# Patient Record
Sex: Male | Born: 1955 | Race: White | Hispanic: No | Marital: Married | Smoking: Never smoker
Health system: Southern US, Community
[De-identification: ages and names within clinical notes are randomized; demographics above are authoritative.]

## PROBLEM LIST (undated history)

## (undated) DIAGNOSIS — M109 Gout, unspecified: Secondary | ICD-10-CM

## (undated) DIAGNOSIS — Z87442 Personal history of urinary calculi: Secondary | ICD-10-CM

## (undated) DIAGNOSIS — N200 Calculus of kidney: Secondary | ICD-10-CM

## (undated) DIAGNOSIS — M1990 Unspecified osteoarthritis, unspecified site: Secondary | ICD-10-CM

## (undated) HISTORY — PX: KIDNEY STONE SURGERY: SHX686

## (undated) HISTORY — PX: APPENDECTOMY: SHX54

---

## 2007-08-24 ENCOUNTER — Emergency Department: Payer: Self-pay | Admitting: Unknown Physician Specialty

## 2007-08-28 ENCOUNTER — Ambulatory Visit: Payer: Self-pay | Admitting: Specialist

## 2007-08-29 ENCOUNTER — Ambulatory Visit: Payer: Self-pay | Admitting: Specialist

## 2007-09-20 ENCOUNTER — Ambulatory Visit: Payer: Self-pay | Admitting: Specialist

## 2009-10-29 ENCOUNTER — Encounter: Payer: Self-pay | Admitting: Gastroenterology

## 2009-11-01 ENCOUNTER — Ambulatory Visit (HOSPITAL_COMMUNITY): Admission: RE | Admit: 2009-11-01 | Discharge: 2009-11-01 | Payer: Self-pay | Admitting: Gastroenterology

## 2009-11-01 ENCOUNTER — Ambulatory Visit: Payer: Self-pay | Admitting: Gastroenterology

## 2010-06-21 NOTE — Letter (Signed)
Summary: instructions  instructions   Imported By: Diana Eves 10/29/2009 15:21:15  _____________________________________________________________________  External Attachment:    Type:   Image     Comment:   External Document

## 2010-06-21 NOTE — Letter (Signed)
Summary: Internal Other  Internal Other   Imported By: Diana Eves 10/29/2009 14:18:07  _____________________________________________________________________  External Attachment:    Type:   Image     Comment:   External Document  Appended Document: Internal Other HALFLYTELY.  Appended Document: Internal Other Informed wife of Rx and instructions faxed to [facility].

## 2011-03-16 ENCOUNTER — Emergency Department (HOSPITAL_COMMUNITY)
Admission: EM | Admit: 2011-03-16 | Discharge: 2011-03-17 | Disposition: A | Discharge status: Home / Self Care | Payer: Worker's Compensation | Attending: Emergency Medicine | Admitting: Emergency Medicine

## 2011-03-16 DIAGNOSIS — X58XXXA Exposure to other specified factors, initial encounter: Secondary | ICD-10-CM | POA: Insufficient documentation

## 2011-03-16 DIAGNOSIS — S61209A Unspecified open wound of unspecified finger without damage to nail, initial encounter: Secondary | ICD-10-CM | POA: Insufficient documentation

## 2014-11-09 ENCOUNTER — Emergency Department (HOSPITAL_COMMUNITY)
Admission: EM | Admit: 2014-11-09 | Discharge: 2014-11-09 | Disposition: A | Discharge status: Home / Self Care | Payer: Self-pay | Attending: Emergency Medicine | Admitting: Emergency Medicine

## 2014-11-09 ENCOUNTER — Encounter (HOSPITAL_COMMUNITY): Payer: Self-pay | Admitting: Emergency Medicine

## 2014-11-09 DIAGNOSIS — L291 Pruritus scroti: Secondary | ICD-10-CM | POA: Insufficient documentation

## 2014-11-09 DIAGNOSIS — M1990 Unspecified osteoarthritis, unspecified site: Secondary | ICD-10-CM | POA: Insufficient documentation

## 2014-11-09 DIAGNOSIS — Z87442 Personal history of urinary calculi: Secondary | ICD-10-CM | POA: Insufficient documentation

## 2014-11-09 DIAGNOSIS — M109 Gout, unspecified: Secondary | ICD-10-CM | POA: Insufficient documentation

## 2014-11-09 DIAGNOSIS — Z79899 Other long term (current) drug therapy: Secondary | ICD-10-CM | POA: Insufficient documentation

## 2014-11-09 HISTORY — DX: Gout, unspecified: M10.9

## 2014-11-09 HISTORY — DX: Calculus of kidney: N20.0

## 2014-11-09 HISTORY — DX: Unspecified osteoarthritis, unspecified site: M19.90

## 2014-11-09 NOTE — Discharge Instructions (Signed)
Pruritus  °Pruritus is an itch. There are many different problems that can cause an itch. Dry skin is one of the most common causes of itching. Most cases of itching do not require medical attention.  °HOME CARE INSTRUCTIONS  °Make sure your skin is moistened on a regular basis. A moisturizer that contains petroleum jelly is best for keeping moisture in your skin. If you develop a rash, you may try the following for relief:  °· Use corticosteroid cream. °· Apply cool compresses to the affected areas. °· Bathe with Epsom salts or baking soda in the bathwater. °· Soak in colloidal oatmeal baths. These are available at your pharmacy. °· Apply baking soda paste to the rash. Stir water into baking soda until it reaches a paste-like consistency. °· Use an anti-itch lotion. °· Take over-the-counter diphenhydramine medicine by mouth as the instructions direct. °· Avoid scratching. Scratching may cause the rash to become infected. If itching is very bad, your caregiver may suggest prescription lotions or creams to lessen your symptoms. °· Avoid hot showers, which can make itching worse. A cold shower may help with itching as long as you use a moisturizer after the shower. °SEEK MEDICAL CARE IF: °The itching does not go away after several days. °Document Released: 01/18/2011 Document Revised: 09/22/2013 Document Reviewed: 01/18/2011 °ExitCare® Patient Information ©2015 [employer]. This information is not intended to replace advice given to you by your health care provider. Make sure you discuss any questions you have with your health care provider. ° °

## 2014-11-09 NOTE — ED Notes (Addendum)
Pt reports itching in his groin and hands along with rednness and swelling in his groin. He reports shaving his groin yesterday, but with the same shave gel and soap that he "has used for years". He tried using cortisone cream on both locations, but the itching and redness have not subsided. Pt has had no difficulty urinating.

## 2014-11-09 NOTE — ED Provider Notes (Signed)
CSN: [id number]     Arrival date & time 11/09/14  7829 History   First MD Initiated Contact with Patient 11/09/14 [phone]     Chief Complaint  Patient presents with  . Pruritis     (Consider location/radiation/quality/duration/timing/severity/associated sxs/prior Treatment) HPI Comments: 59 yo male with scrotal itching.  Began a few hours ago.  Gradual onset.  Severe.  Improved eventually with hydrocortisone cream.  Nothing worsened.  Associated with mild swelling, which has now resolved.  No difficulty urinating, no testicular pain, no penile discharge.    Reports a scrotal lesion from a tick bite two weeks ago.  He removed the tick at that time.     Past Medical History  Diagnosis Date  . Arthritis   . Gout   . Kidney stone    Past Surgical History  Procedure Laterality Date  . Kidney stone surgery    . Appendectomy     History reviewed. No pertinent family history. History  Substance Use Topics  . Smoking status: Never Smoker   . Smokeless tobacco: Not on file  . Alcohol Use: Yes     Comment: occassionally     Review of Systems  All other systems reviewed and are negative.     Allergies  Review of patient's allergies indicates no known allergies.  Home Medications   Prior to Admission medications   Medication Sig Start Date End Date Taking? Authorizing Provider  chlorpheniramine (CHLOR-TRIMETON) 4 MG tablet Take 8 mg by mouth daily.   Yes Historical Provider, MD  colchicine 0.6 MG tablet Take 0.6 mg by mouth daily.   Yes Historical Provider, MD  nabumetone (RELAFEN) 750 MG tablet Take 750 mg by mouth daily as needed for moderate pain.   Yes Historical Provider, MD  naproxen sodium (ANAPROX) 220 MG tablet Take 440 mg by mouth 2 (two) times daily as needed (pain).   Yes Historical Provider, MD   BP 137/99 mmHg  Pulse 91  Temp(Src) 97.9 F (36.6 C) (Oral)  Resp 18  Ht  (1.753 m)  Wt 215 lb (97.523 kg)  BMI 31.74 kg/m2  SpO2 99% Physical Exam   Constitutional: He is oriented to person, place, and time. He appears well-developed and well-nourished. No distress.  HENT:  Head: Normocephalic and atraumatic.  Eyes: Conjunctivae are normal. No scleral icterus.  Neck: Neck supple.  Cardiovascular: Normal rate and intact distal pulses.   Pulmonary/Chest: Effort normal. No stridor. No respiratory distress.  Abdominal: Normal appearance. He exhibits no distension.  Neurological: He is alert and oriented to person, place, and time.  Skin: Skin is warm and dry. No rash noted.  No rash on hands. No scrotal swelling or erythema.  Skin recently shaved.  No excoriations.  Small papular lesion in left hemiscrotum reported to be from recent tick bite.  No testicular tenderness or masses.  No GU lymphadenopathy.   Psychiatric: He has a normal mood and affect. His behavior is normal.  Nursing note and vitals reviewed.   ED Course  Procedures (including critical care time) Labs Review Labs Reviewed - No data to display  Imaging Review No results found.   EKG Interpretation None      MDM   Final diagnoses:  Scrotal itching    59 yo male with scrotal itching, now near resolved.  Could be from contact dermatitis or could be related to shaving his groin yesterday.  No evidence of infection.  Advised supportive treatment and avoiding exposures.  Blake Divine, MD 11/09/14 [phone]

## 2015-08-16 DIAGNOSIS — Z79899 Other long term (current) drug therapy: Secondary | ICD-10-CM | POA: Insufficient documentation

## 2015-08-16 DIAGNOSIS — M1A00X0 Idiopathic chronic gout, unspecified site, without tophus (tophi): Secondary | ICD-10-CM | POA: Insufficient documentation

## 2015-10-24 ENCOUNTER — Encounter (HOSPITAL_COMMUNITY): Payer: Self-pay | Admitting: Emergency Medicine

## 2015-10-24 ENCOUNTER — Emergency Department (HOSPITAL_COMMUNITY): Payer: Managed Care, Other (non HMO)

## 2015-10-24 ENCOUNTER — Emergency Department (HOSPITAL_COMMUNITY)
Admission: EM | Admit: 2015-10-24 | Discharge: 2015-10-24 | Disposition: A | Discharge status: Home / Self Care | Payer: Managed Care, Other (non HMO) | Attending: Emergency Medicine | Admitting: Emergency Medicine

## 2015-10-24 DIAGNOSIS — M25572 Pain in left ankle and joints of left foot: Secondary | ICD-10-CM | POA: Diagnosis not present

## 2015-10-24 DIAGNOSIS — Y9389 Activity, other specified: Secondary | ICD-10-CM | POA: Insufficient documentation

## 2015-10-24 DIAGNOSIS — M7051 Other bursitis of knee, right knee: Secondary | ICD-10-CM | POA: Insufficient documentation

## 2015-10-24 DIAGNOSIS — M25531 Pain in right wrist: Secondary | ICD-10-CM | POA: Diagnosis not present

## 2015-10-24 DIAGNOSIS — M109 Gout, unspecified: Secondary | ICD-10-CM | POA: Diagnosis present

## 2015-10-24 MED ORDER — OXYCODONE-ACETAMINOPHEN 5-325 MG PO TABS: 1.0000 | ORAL_TABLET | ORAL | Status: DC | PRN

## 2015-10-24 MED ORDER — DEXAMETHASONE SODIUM PHOSPHATE 4 MG/ML IJ SOLN
10.0000 mg | Freq: Once | INTRAMUSCULAR | Status: AC
  Administered 2015-10-24: 10 mg via INTRAMUSCULAR
  Filled 2015-10-24: qty 3

## 2015-10-24 MED ORDER — OXYCODONE-ACETAMINOPHEN 5-325 MG PO TABS
1.0000 | ORAL_TABLET | Freq: Once | ORAL | Status: AC
  Administered 2015-10-24: 1 via ORAL
  Filled 2015-10-24: qty 1

## 2015-10-24 NOTE — ED Notes (Signed)
Pt states he has been having a gout flare for about 2 weeks.  C/o pain in right wrist, left ankle, and right knee.  States is being tx with allopurinol but not helping.

## 2015-10-24 NOTE — Discharge Instructions (Signed)
Gout °Gout is when your joints become red, sore, and swell (inflamed). This is caused by the buildup of uric acid crystals in the joints. Uric acid is a chemical that is normally in the blood. If the level of uric acid gets too high in the blood, these crystals form in your joints and tissues. Over time, these crystals can form into masses near the joints and tissues. These masses can destroy bone and cause the bone to look misshapen (deformed). °HOME CARE  °· Do not take aspirin for pain. °· Only take medicine as told by your doctor. °· Rest the joint as much as you can. When in bed, keep sheets and blankets off painful areas. °· Keep the sore joints raised (elevated). °· Put warm or cold packs on painful joints. Use of warm or cold packs depends on which works best for you. °· Use crutches if the painful joint is in your leg. °· Drink enough fluids to keep your pee (urine) clear or pale yellow. Limit alcohol, sugary drinks, and drinks with fructose in them. °· Follow your diet instructions. Pay careful attention to how much protein you eat. Include fruits, vegetables, whole grains, and fat-free or low-fat milk products in your daily diet. Talk to your doctor or dietitian about the use of coffee, vitamin C, and cherries. These may help lower uric acid levels. °· Keep a healthy body weight. °GET HELP RIGHT AWAY IF:  °· You have watery poop (diarrhea), throw up (vomit), or have any side effects from medicines. °· You do not feel better in 24 hours, or you are getting worse. °· Your joint becomes suddenly more tender, and you have chills or a fever. °MAKE SURE YOU:  °· Understand these instructions. °· Will watch your condition. °· Will get help right away if you are not doing well or get worse. °  °This information is not intended to replace advice given to you by your health care provider. Make sure you discuss any questions you have with your health care provider. °  °Document Released: 02/15/2008 Document Revised:  05/29/2014 Document Reviewed: 12/20/2011 °Elsevier Interactive Patient Education ©2016 [employer] ° °

## 2015-10-24 NOTE — ED Provider Notes (Signed)
CSN: [id number]     Arrival date & time 10/24/15  1525 History   By signing my name below, I, Marc Castillo, attest that this documentation has been prepared under the direction and in the presence of [name], PA-C. Electronically Signed: Evon Slackerrance Castillo, ED Scribe. 10/24/2015. 3:50 PM.    Chief Complaint  Patient presents with  . Gout   The history is provided by the patient. No language interpreter was used.   HPI Comments: Marc Castillo is a 60 y.o. male who presents to the Emergency Department complaining of worsening gout flare in his left heel and right knee for the last 2 weeks. Pt is also complaining of right wrist pain as well. Pt reports that he does a lot of repetitive work with his right wrist. He states that he has associated swelling. Pt presents wearing a wrist splint. Pt states that he has applied ice with no relief. Pt states that he has been taking allopurinol with no relief. Pt reports that he recently had a medication change in December 2016 and his gout has not been well controlled since. Pt states that he usually has relief with colchicine or steroid taper. Pt states that he is suppose to start Uloric in 2 days. He denies fever, chills, redness. Numbness or weakness  Past Medical History  Diagnosis Date  . Arthritis   . Gout   . Kidney stone    Past Surgical History  Procedure Laterality Date  . Kidney stone surgery    . Appendectomy     History reviewed. No pertinent family history. Social History  Substance Use Topics  . Smoking status: Never Smoker   . Smokeless tobacco: None  . Alcohol Use: Yes     Comment: occassionally     Review of Systems  Constitutional: Negative for fever and chills.  Respiratory: Negative for shortness of breath.   Cardiovascular: Negative for chest pain.  Gastrointestinal: Negative for nausea and vomiting.  Musculoskeletal: Positive for joint swelling and arthralgias.  Skin: Negative for color change and wound.   Neurological: Negative for dizziness, weakness and numbness.  All other systems reviewed and are negative.    Allergies  Review of patient's allergies indicates no known allergies.  Home Medications   Prior to Admission medications   Medication Sig Start Date End Date Taking? Authorizing Provider  chlorpheniramine (CHLOR-TRIMETON) 4 MG tablet Take 8 mg by mouth daily.    Historical Provider, MD  colchicine 0.6 MG tablet Take 0.6 mg by mouth daily.    Historical Provider, MD  nabumetone (RELAFEN) 750 MG tablet Take 750 mg by mouth daily as needed for moderate pain.    Historical Provider, MD  naproxen sodium (ANAPROX) 220 MG tablet Take 440 mg by mouth 2 (two) times daily as needed (pain).    Historical Provider, MD   BP 129/73 mmHg  Pulse 99  Temp(Src) 98.1 F (36.7 C) (Oral)  Resp 18  Ht 5\' 9"  (1.753 m)  Wt 215 lb (97.523 kg)  BMI 31.74 kg/m2  SpO2 99%    Physical Exam  Constitutional: He is oriented to person, place, and time. He appears well-developed and well-nourished. No distress.  HENT:  Head: Normocephalic and atraumatic.  Eyes: Conjunctivae and EOM are normal.  Neck: Neck supple. No tracheal deviation present.  Cardiovascular: Normal rate, regular rhythm and normal heart sounds.   Pulmonary/Chest: Effort normal. No respiratory distress.  Musculoskeletal: Normal range of motion. He exhibits tenderness.  Diffuse edema and tenderness of the  distal right wrist, no erythema, sensation intact. Mild edema and tenderness of the left ankle, pain through ROM of the right knee, mild crepitus.    Neurological: He is alert and oriented to person, place, and time.  Skin: Skin is warm and dry.  Psychiatric: He has a normal mood and affect. His behavior is normal.  Nursing note and vitals reviewed.   ED Course  Procedures (including critical care time) DIAGNOSTIC STUDIES: Oxygen Saturation is 99% on RA, normal by my interpretation.    COORDINATION OF CARE: 3:48  PM-Discussed treatment plan with pt at bedside and pt agreed to plan.     Labs Review Labs Reviewed - No data to display  Imaging Review Dg Knee Complete 4 Views Right  10/24/2015  CLINICAL DATA:  Anterior knee pain and swelling. No known injury. History of gout. EXAM: RIGHT KNEE - COMPLETE 4+ VIEW COMPARISON:  None. FINDINGS: The mineralization and alignment are normal. There is no evidence of acute fracture or dislocation. The joint spaces are maintained. There is mild spurring at the quadriceps insertion on the patella. There is no significant joint effusion, although there is mild anterior soft tissue edema. No soft tissue calcifications or erosions are seen. IMPRESSION: Prepatellar soft tissue edema suggesting possible prepatellar bursitis. No acute osseous findings or significant joint effusion. Electronically Signed   By: Carey Bullocks M.D.   On: 10/24/2015 16:35       EKG Interpretation None      MDM   Final diagnoses:  Acute gout of left foot, unspecified cause  Bursitis, knee, right   Pt well appearing, non-toxic.  No concerning sx's for septic joint.  Has hx of gout which is likely to the left foot.  I fel that right knee pain may be related to a inflammatory bursitis.  Pt is to start Urolic in two days.  IM injection of decadron given, percocet.  Pain improved.  Remains NV intact.  Agrees to arrange orthopedic f/u, referral info given  I personally performed the services described in this documentation, which was scribed in my presence. The recorded information has been reviewed and is accurate.      Pauline Aus, PA-C 10/25/15 2049  Marily Memos, MD 10/26/15 [phone]

## 2016-10-05 DIAGNOSIS — M7730 Calcaneal spur, unspecified foot: Secondary | ICD-10-CM | POA: Insufficient documentation

## 2019-10-07 ENCOUNTER — Encounter: Payer: Self-pay | Admitting: Gastroenterology

## 2020-02-01 ENCOUNTER — Encounter (HOSPITAL_COMMUNITY): Payer: Self-pay | Admitting: Emergency Medicine

## 2020-02-01 ENCOUNTER — Other Ambulatory Visit: Payer: Self-pay

## 2020-02-01 ENCOUNTER — Emergency Department (HOSPITAL_COMMUNITY)
Admission: EM | Admit: 2020-02-01 | Discharge: 2020-02-01 | Disposition: A | Discharge status: Home / Self Care | Payer: 59 | Attending: Emergency Medicine | Admitting: Emergency Medicine

## 2020-02-01 ENCOUNTER — Emergency Department (HOSPITAL_COMMUNITY): Payer: 59

## 2020-02-01 DIAGNOSIS — Y9389 Activity, other specified: Secondary | ICD-10-CM | POA: Diagnosis not present

## 2020-02-01 DIAGNOSIS — W271XXA Contact with garden tool, initial encounter: Secondary | ICD-10-CM | POA: Insufficient documentation

## 2020-02-01 DIAGNOSIS — Y92007 Garden or yard of unspecified non-institutional (private) residence as the place of occurrence of the external cause: Secondary | ICD-10-CM | POA: Insufficient documentation

## 2020-02-01 DIAGNOSIS — Z79899 Other long term (current) drug therapy: Secondary | ICD-10-CM | POA: Insufficient documentation

## 2020-02-01 DIAGNOSIS — Y999 Unspecified external cause status: Secondary | ICD-10-CM | POA: Diagnosis not present

## 2020-02-01 DIAGNOSIS — S59911A Unspecified injury of right forearm, initial encounter: Secondary | ICD-10-CM | POA: Diagnosis not present

## 2020-02-01 DIAGNOSIS — S4991XA Unspecified injury of right shoulder and upper arm, initial encounter: Secondary | ICD-10-CM

## 2020-02-01 NOTE — ED Triage Notes (Signed)
Pt reports right arm pain. Pt reports "it hurts to turn my forearm around." Pt reports trying to stop a lawnmower from rolling with his right arm.

## 2020-02-01 NOTE — ED Provider Notes (Signed)
[facility] EMERGENCY DEPARTMENT Provider Note   CSN: [id number] Arrival date & time: 02/01/20  0239     History Chief Complaint  Patient presents with  . Arm Injury    Marc Castillo is a 64 y.o. male.  HPI     This is a 64 year old male with a history of gout and arthritis who presents with right arm pain.  Patient reports 1 week ago he was stopping a lawnmower with his right hand his hand was pushed backwards.  He did not take a direct hit to the forearm that has since had pain along the forearm and swelling of the anterior aspect of the proximal forearm.  He also states he has had significant pain with pronation and supination.  He is right-handed.  Tonight while he was at work he felt a pop and had worsening pain.  He has been taking indomethacin which has helped he is also using ice.  No numbness or tingling.  Currently his pain is 0 when he is at rest.  Past Medical History:  Diagnosis Date  . Arthritis   . Gout   . Kidney stone     There are no problems to display for this patient.   Past Surgical History:  Procedure Laterality Date  . APPENDECTOMY    . KIDNEY STONE SURGERY         No family history on file.  Social History   Tobacco Use  . Smoking status: Never Smoker  . Smokeless tobacco: Never Used  Substance Use Topics  . Alcohol use: Yes    Comment: occassionally   . Drug use: Not on file    Home Medications Prior to Admission medications   Medication Sig Start Date End Date Taking? Authorizing Provider  chlorpheniramine (CHLOR-TRIMETON) 4 MG tablet Take 8 mg by mouth daily.    [provider]  colchicine 0.6 MG tablet Take 0.6 mg by mouth daily.    [provider]  nabumetone (RELAFEN) 750 MG tablet Take 750 mg by mouth daily as needed for moderate pain.    [provider]  naproxen sodium (ANAPROX) 220 MG tablet Take 440 mg by mouth 2 (two) times daily as needed (pain).    [provider]    oxyCODONE-acetaminophen (PERCOCET/ROXICET) 5-325 MG tablet Take 1 tablet by mouth every 4 (four) hours as needed. 10/24/15   Triplett, Tammy, PA-C    Allergies    Patient has no known allergies.  Review of Systems   Review of Systems  Musculoskeletal:       Right arm pain  Neurological: Negative for weakness and numbness.  All other systems reviewed and are negative.   Physical Exam Updated Vital Signs BP (!) 152/105   Pulse 90   Temp 98 F (36.7 C) (Oral)   Resp 18   Ht 1.753 m (5\' 9" )   Wt 97.5 kg   SpO2 95%   BMI 31.75 kg/m   Physical Exam Vitals and nursing note reviewed.  Constitutional:      Appearance: He is well-developed.  HENT:     Head: Normocephalic and atraumatic.     Mouth/Throat:     Mouth: Mucous membranes are moist.  Eyes:     Pupils: Pupils are equal, round, and reactive to light.  Cardiovascular:     Rate and Rhythm: Normal rate and regular rhythm.  Pulmonary:     Effort: Pulmonary effort is normal. No respiratory distress.  Musculoskeletal:  General: No deformity.     Cervical back: Neck supple.     Comments: Focused examination of the right upper extremity with tenderness along the mid towards the proximal forearm anteriorly, no significant swelling noted, both pronation and supination as well as flexion and extension at the elbow, slight bruising noted over the mid forearm  Skin:    General: Skin is warm and dry.  Neurological:     Mental Status: He is alert and oriented to person, place, and time.  Psychiatric:        Mood and Affect: Mood normal.     ED Results / Procedures / Treatments   Labs (all labs ordered are listed, but only abnormal results are displayed) Labs Reviewed - No data to display  EKG None  Radiology DG Forearm Right  Result Date: 02/01/2020 CLINICAL DATA:  Mild trauma. EXAM: RIGHT FOREARM - 2 VIEW COMPARISON:  None. FINDINGS: Cortical irregularity along the midshaft radius along the ulnar side which is  favored a tendons insertion site. A bony excrescence from the proximal radius 1 cm from the radial head is favored a benign bony such as osteochondroma. No acute fracture dislocation identified. IMPRESSION: 1. No acute findings. 2. Two bony lesions in the radius are favored benign as described above. If pain persists recommend CT or MRI of the forearm. Electronically Signed   By: Genevive Bi M.D.   On: 02/01/2020 06:13    Procedures Procedures (including critical care time)  Medications Ordered in ED Medications - No data to display  ED Course  I have reviewed the triage vital signs and the nursing notes.  Pertinent labs & imaging results that were available during my care of the patient were reviewed by me and considered in my medical decision making (see chart for details).    MDM Rules/Calculators/A&P                           Patient presents with injury to the right forearm.  He is overall nontoxic vital signs notable for blood pressure of 152/105.  Denies direct hit to the forearm and has intact range of motion without obvious deformity.  X-ray obtained and shows no evidence of fracture.  There is a small cortical irregularity along the midshaft which is favored to be a tendon insertion.  I reviewed the x-rays independently.  I discussed with the patient that he could have a soft tissue or tendon injury although his exam is very reassuring.  He is neurovascularly intact.  Recommend continued ice and NSAIDs.  Also have recommended compression with an Ace wrap given reports of swelling although objectively I do not see any significant swelling.  Patient stated understanding.  After history, exam, and medical workup I feel the patient has been appropriately medically screened and is safe for discharge home. Pertinent diagnoses were discussed with the patient. Patient was given return precautions.   Final Clinical Impression(s) / ED Diagnoses Final diagnoses:  Injury of right upper  extremity, initial encounter    Rx / DC Orders ED Discharge Orders    None       Randell Teare, Mayer Masker, MD 02/01/20 [phone]

## 2020-02-01 NOTE — Discharge Instructions (Addendum)
You were seen today for an arm injury.  Your x-rays are negative for fracture.  Continue ice and anti-inflammatories.  Additionally, you may compress if this helps her symptoms.

## 2020-02-24 ENCOUNTER — Other Ambulatory Visit: Payer: Self-pay

## 2020-02-24 ENCOUNTER — Ambulatory Visit (INDEPENDENT_AMBULATORY_CARE_PROVIDER_SITE_OTHER): Payer: Managed Care, Other (non HMO) | Admitting: Orthopaedic Surgery

## 2020-02-24 ENCOUNTER — Encounter: Payer: Self-pay | Admitting: Orthopaedic Surgery

## 2020-02-24 VITALS — BP 157/93 | HR 84 | Ht 70.0 in | Wt 215.0 lb

## 2020-02-24 DIAGNOSIS — R936 Abnormal findings on diagnostic imaging of limbs: Secondary | ICD-10-CM | POA: Diagnosis not present

## 2020-02-24 DIAGNOSIS — M79631 Pain in right forearm: Secondary | ICD-10-CM

## 2020-02-24 NOTE — Progress Notes (Signed)
Subjective:    Patient ID: Marc Castillo, male    DOB: November 23, 1955, 64 y.o.   MRN: [id number]  HPI  He had pain in the right dominant forearm, mid forearm while working at [employer] on Saturday September 4.  He had pain that lasted a few minutes.  It was sore for a few days then got better.  On Saturday, September 11, while working on the same machine at work, he reached to his side to grab something.  He felt a pop and had significant pain for a few minutes.  It slowly subsided. He went to the ER the next day, and was evaluated. X-rays were done which showed: IMPRESSION: 1. No acute findings. 2. Two bony lesions in the radius are favored benign as described above. If pain persists recommend CT or MRI of the forearm.  He was seen by his family doctor and kept out of work the week of September 12.  He was given prednisone which helped.  He went back to work September 20 and has been at work since then.  He has some pain at times but overall is better.  It is still sore.  I have told him of the x-ray report and have given him a copy.  I will get MRI.  He understands.    Review of Systems  Constitutional: Positive for activity change.  Musculoskeletal: Positive for arthralgias.  All other systems reviewed and are negative.  For Review of Systems, all other systems reviewed and are negative.  The following is a summary of the past history medically, past history surgically, known current medicines, social history and family history.  This information is gathered electronically by the computer from prior information and documentation.  I review this each visit and have found including this information at this point in the chart is beneficial and informative.   Past Medical History:  Diagnosis Date  . Arthritis   . Gout   . Kidney stone     Past Surgical History:  Procedure Laterality Date  . APPENDECTOMY    . KIDNEY STONE SURGERY      Current Outpatient Medications  on File Prior to Visit  Medication Sig Dispense Refill  . atorvastatin (LIPITOR) 20 MG tablet Take 20 mg by mouth daily.    . febuxostat (ULORIC) 40 MG tablet Take 1 tablet by mouth daily.    . indomethacin (INDOCIN) 50 MG capsule TAKE 1 CAPSULE BY MOUTH 2 TO 3 TIMES DAILY FOR GOUT FLARE.     No current facility-administered medications on file prior to visit.    Social History   Socioeconomic History  . Marital status: Married    Spouse name: Not on file  . Number of children: Not on file  . Years of education: Not on file  . Highest education level: Not on file  Occupational History  . Not on file  Tobacco Use  . Smoking status: Never Smoker  . Smokeless tobacco: Never Used  Substance and Sexual Activity  . Alcohol use: Yes    Comment: occassionally   . Drug use: Not on file  . Sexual activity: Not on file  Other Topics Concern  . Not on file  Social History Narrative  . Not on file   Social Determinants of Health   Financial Resource Strain:   . Difficulty of Paying Living Expenses: Not on file  Food Insecurity:   . Worried About Programme researcher, broadcasting/film/video in the Last Year: Not  on file  . Ran Out of Food in the Last Year: Not on file  Transportation Needs:   . Lack of Transportation (Medical): Not on file  . Lack of Transportation (Non-Medical): Not on file  Physical Activity:   . Days of Exercise per Week: Not on file  . Minutes of Exercise per Session: Not on file  Stress:   . Feeling of Stress : Not on file  Social Connections:   . Frequency of Communication with Friends and Family: Not on file  . Frequency of Social Gatherings with Friends and Family: Not on file  . Attends Religious Services: Not on file  . Active Member of Clubs or Organizations: Not on file  . Attends Banker Meetings: Not on file  . Marital Status: Not on file  Intimate Partner Violence:   . Fear of Current or Ex-Partner: Not on file  . Emotionally Abused: Not on file  .  Physically Abused: Not on file  . Sexually Abused: Not on file    Family History  Problem Relation Age of Onset  . Diabetes Father     BP (!) 157/93   Pulse 84   Ht 5\' 10"  (1.778 m)   Wt 215 lb (97.5 kg)   BMI 30.85 kg/m   Body mass index is 30.85 kg/m.      Objective:   Physical Exam Vitals and nursing note reviewed. Exam conducted with a chaperone present.  Constitutional:      Appearance: He is well-developed.  HENT:     Head: Normocephalic and atraumatic.  Eyes:     Conjunctiva/sclera: Conjunctivae normal.     Pupils: Pupils are equal, round, and reactive to light.  Cardiovascular:     Rate and Rhythm: Normal rate and regular rhythm.  Pulmonary:     Effort: Pulmonary effort is normal.  Abdominal:     Palpations: Abdomen is soft.  Musculoskeletal:       Arms:     Cervical back: Normal range of motion and neck supple.  Skin:    General: Skin is warm and dry.  Neurological:     Mental Status: He is alert and oriented to person, place, and time.     Cranial Nerves: No cranial nerve deficit.     Motor: No abnormal muscle tone.     Coordination: Coordination normal.     Deep Tendon Reflexes: Reflexes are normal and symmetric. Reflexes normal.  Psychiatric:        Behavior: Behavior normal.        Thought Content: Thought content normal.        Judgment: Judgment normal.     I have reviewed the ER reports.  I have independently reviewed and interpreted x-rays of this patient done at another site by another physician or qualified health professional.       Assessment & Plan:   Encounter Diagnoses  Name Primary?  . Right forearm pain Yes  . Abnormal x-ray of forearm    I will get MRI of the right forearm with and without contrast as recommended.  Return in two weeks.  Call if any problem.  Precautions discussed.   Electronically Signed , MD 10/5/20218:51 AM

## 2020-02-24 NOTE — Patient Instructions (Signed)
Go for labs at [facility] to check your kidney functions for the MRI scan with contrast  MRI scan has been ordered for you, we will get approval for the scan if needed, then Marc Castillo will call you with appointment.

## 2020-02-25 LAB — COMPLETE METABOLIC PANEL WITH GFR
AG Ratio: 1.7 (calc) (ref 1.0–2.5)
ALT: 29 U/L (ref 9–46)
AST: 30 U/L (ref 10–35)
Albumin: 4.1 g/dL (ref 3.6–5.1)
Alkaline phosphatase (APISO): 97 U/L (ref 35–144)
BUN: 18 mg/dL (ref 7–25)
CO2: 30 mmol/L (ref 20–32)
Calcium: 8.9 mg/dL (ref 8.6–10.3)
Chloride: 106 mmol/L (ref 98–110)
Creat: 0.95 mg/dL (ref 0.70–1.25)
GFR, Est African American: 99 mL/min/{1.73_m2} (ref >=60)
GFR, Est Non African American: 85 mL/min/{1.73_m2} (ref >=60)
Globulin: 2.4 g/dL (calc) (ref 1.9–3.7)
Glucose, Bld: 90 mg/dL (ref 65–139)
Potassium: 4.6 mmol/L (ref 3.5–5.3)
Sodium: 141 mmol/L (ref 135–146)
Total Bilirubin: 0.6 mg/dL (ref 0.2–1.2)
Total Protein: 6.5 g/dL (ref 6.1–8.1)

## 2020-03-05 ENCOUNTER — Ambulatory Visit (HOSPITAL_COMMUNITY)
Admission: RE | Admit: 2020-03-05 | Discharge: 2020-03-05 | Disposition: A | Discharge status: Home / Self Care | Payer: 59 | Source: Ambulatory Visit | Attending: Orthopaedic Surgery | Admitting: Orthopaedic Surgery

## 2020-03-05 ENCOUNTER — Other Ambulatory Visit: Payer: Self-pay

## 2020-03-05 DIAGNOSIS — M79631 Pain in right forearm: Secondary | ICD-10-CM | POA: Insufficient documentation

## 2020-03-05 DIAGNOSIS — R936 Abnormal findings on diagnostic imaging of limbs: Secondary | ICD-10-CM | POA: Diagnosis present

## 2020-03-05 MED ORDER — GADOBUTROL 1 MMOL/ML IV SOLN
10.0000 mL | Freq: Once | INTRAVENOUS | Status: AC | PRN
  Administered 2020-03-05: 10 mL via INTRAVENOUS

## 2020-03-09 ENCOUNTER — Encounter: Payer: Self-pay | Admitting: Orthopaedic Surgery

## 2020-03-09 ENCOUNTER — Other Ambulatory Visit: Payer: Self-pay

## 2020-03-09 ENCOUNTER — Ambulatory Visit: Payer: 59 | Admitting: Orthopaedic Surgery

## 2020-03-09 VITALS — BP 146/92 | HR 78 | Ht 70.0 in | Wt 215.0 lb

## 2020-03-09 DIAGNOSIS — S46211A Strain of muscle, fascia and tendon of other parts of biceps, right arm, initial encounter: Secondary | ICD-10-CM | POA: Diagnosis not present

## 2020-03-09 DIAGNOSIS — S46219A Strain of muscle, fascia and tendon of other parts of biceps, unspecified arm, initial encounter: Secondary | ICD-10-CM

## 2020-03-09 NOTE — Progress Notes (Signed)
Patient RX:[id number] Marc Castillo, male DOB:01-29-1956, 64 y.o. YPP:[id number]  Chief Complaint  Patient presents with  . Arm Pain    right forearm   . Results    review MRI     HPI  Marc Castillo is a 64 y.o. male who has right forearm pain. He had the MRI done and it showed: IMPRESSION: 1. Complete tear of the distal biceps tendon with 3.3 cm retraction. 2. No focal bone lesion.   His initial injury was September 4 with repeat injury and much more pain was September 11.  He was seen in the ER.  I saw him October 5 and got the MRI which he just had done.  I have explained the findings to him.  The length of time since injury may preclude any surgery since he does have retraction.  I will have him see Dr. Deno Etienne to get his evaluation and input.  The patient is agreeable to this.  He still has pain.   Body mass index is 30.85 kg/m.  ROS  Review of Systems  Constitutional: Positive for activity change.  Musculoskeletal: Positive for arthralgias.  All other systems reviewed and are negative.   All other systems reviewed and are negative.  The following is a summary of the past history medically, past history surgically, known current medicines, social history and family history.  This information is gathered electronically by the computer from prior information and documentation.  I review this each visit and have found including this information at this point in the chart is beneficial and informative.    Past Medical History:  Diagnosis Date  . Arthritis   . Gout   . Kidney stone     Past Surgical History:  Procedure Laterality Date  . APPENDECTOMY    . KIDNEY STONE SURGERY      Family History  Problem Relation Age of Onset  . Diabetes Father     Social History Social History   Tobacco Use  . Smoking status: Never Smoker  . Smokeless tobacco: Never Used  Substance Use Topics  . Alcohol use: Yes    Comment: occassionally   . Drug use: Not on file    No Known  Allergies  Current Outpatient Medications  Medication Sig Dispense Refill  . atorvastatin (LIPITOR) 20 MG tablet Take 20 mg by mouth daily.    . febuxostat (ULORIC) 40 MG tablet Take 1 tablet by mouth daily.    . indomethacin (INDOCIN) 50 MG capsule TAKE 1 CAPSULE BY MOUTH 2 TO 3 TIMES DAILY FOR GOUT FLARE.     No current facility-administered medications for this visit.     Physical Exam  Blood pressure (!) 146/92, pulse 78, height 5\' 10"  (1.778 m), weight 215 lb (97.5 kg).  Constitutional: overall normal hygiene, normal nutrition, well developed, normal grooming, normal body habitus. Assistive device:none  Musculoskeletal: gait and station Limp none, muscle tone and strength are normal, no tremors or atrophy is present.  .  Neurological: coordination overall normal.  Deep tendon reflex/nerve stretch intact.  Sensation normal.  Cranial nerves II-XII intact.   Skin:   Normal overall no scars, lesions, ulcers or rashes. No psoriasis.  Psychiatric: Alert and oriented x 3.  Recent memory intact, remote memory unclear.  Normal mood and affect. Well groomed.  Good eye contact.  Cardiovascular: overall no swelling, no varicosities, no edema bilaterally, normal temperatures of the legs and arms, no clubbing, cyanosis and good capillary refill.  Lymphatic: palpation is normal.  Right forearm tender proximal and volar.  I do not feel defect.  He has no redness.  ROM is full but tender.  NV intact.  All other systems reviewed and are negative   The patient has been educated about the nature of the problem(s) and counseled on treatment options.  The patient appeared to understand what I have discussed and is in agreement with it.  Encounter Diagnosis  Name Primary?  . Biceps tendon tear Yes    PLAN Call if any problems.  Precautions discussed.  Continue current medications.   Return to clinic to see Dr. Deno Etienne   Electronically Signed Darreld Mclean, MD 10/19/202110:52 AM

## 2020-03-17 ENCOUNTER — Other Ambulatory Visit: Payer: Self-pay

## 2020-03-17 ENCOUNTER — Encounter: Payer: Self-pay | Admitting: Orthopaedic Surgery

## 2020-03-17 ENCOUNTER — Ambulatory Visit: Payer: 59 | Admitting: Orthopaedic Surgery

## 2020-03-17 DIAGNOSIS — S46211A Strain of muscle, fascia and tendon of other parts of biceps, right arm, initial encounter: Secondary | ICD-10-CM

## 2020-03-17 NOTE — Progress Notes (Signed)
Office Visit Note   Patient: Marc Castillo           Date of Birth: [birth year]           MRN: [id number] Visit Date: 03/17/2020              Requested by: Darreld Mclean, MD [address] [place],  [place] [address] PCP: [facility]   Assessment & Plan: Visit Diagnoses:  1. Rupture of right distal biceps tendon, initial encounter     Plan: Impression is chronic right distal biceps rupture.  I evaluated the MRI and the original x-rays and we had a lengthy discussion on treatment options and realistic expectations for strength and ultimate function and based on our discussion we will go with conservative treatment and outpatient PT for about 6 weeks.  I would like to recent check him in about 6 weeks.  We provided him with a referral to accelerated PT in [place] today.  Follow-Up Instructions: Return in about 6 weeks (around 04/28/2020).   Orders:  No orders of the defined types were placed in this encounter.  No orders of the defined types were placed in this encounter.     Procedures: No procedures performed   Clinical Data: No additional findings.   Subjective: Chief Complaint  Patient presents with  . Right Forearm - Pain    Marc Castillo is a very pleasant 64 year old gentleman right-hand-dominant comes in for chronic right distal biceps rupture.  He originally had in the injury on September 11 and was evaluated in the ER at [facility].  He continued to have weakness and some mild pain and so he was seen by Dr. Marshell Levan who subsequently ordered an MRI which showed a distal biceps rupture with about 3 cm of retraction.  He has been referred here by Dr. Hilda Lias.  Overall he states that the pain is minimal.  He mainly notices weakness with supination and easy fatigability.  He has still been able to perform his job without any issues.   Review of Systems  Constitutional: Negative.   All other systems reviewed and are  negative.    Objective: Vital Signs: There were no vitals taken for this visit.  Physical Exam Vitals and nursing note reviewed.  Constitutional:      Appearance: He is well-developed.  HENT:     Head: Normocephalic and atraumatic.  Eyes:     Pupils: Pupils are equal, round, and reactive to light.  Pulmonary:     Effort: Pulmonary effort is normal.  Abdominal:     Palpations: Abdomen is soft.  Musculoskeletal:        General: Normal range of motion.     Cervical back: Neck supple.  Skin:    General: Skin is warm.  Neurological:     Mental Status: He is alert and oriented to person, place, and time.  Psychiatric:        Behavior: Behavior normal.        Thought Content: Thought content normal.        Judgment: Judgment normal.     Ortho Exam Right forearm shows no bruising or swelling or tenderness palpation.  He has good elbow flexion strength without pain.  He has moderate pain with resisted forearm supination.  Elbow range of motion is normal.  Specialty Comments:  No specialty comments available.  Imaging: No results found.   PMFS History: Patient Active Problem List   Diagnosis Date Noted  .  Rupture of right distal biceps tendon 03/17/2020  . Bone spur on posterior portion of calcaneus 10/05/2016  . Chronic gouty arthropathy without tophi 08/16/2015  . Encounter for long-term (current) use of high-risk medication 08/16/2015   Past Medical History:  Diagnosis Date  . Arthritis   . Gout   . Kidney stone     Family History  Problem Relation Age of Onset  . Diabetes Father     Past Surgical History:  Procedure Laterality Date  . APPENDECTOMY    . KIDNEY STONE SURGERY     Social History   Occupational History  . Not on file  Tobacco Use  . Smoking status: Never Smoker  . Smokeless tobacco: Never Used  Substance and Sexual Activity  . Alcohol use: Yes    Comment: occassionally   . Drug use: Not on file  . Sexual activity: Not on file

## 2020-04-28 ENCOUNTER — Other Ambulatory Visit: Payer: Self-pay

## 2020-04-28 ENCOUNTER — Ambulatory Visit: Payer: 59 | Admitting: Orthopedic Surgery

## 2020-04-28 ENCOUNTER — Ambulatory Visit (INDEPENDENT_AMBULATORY_CARE_PROVIDER_SITE_OTHER): Payer: 59 | Admitting: Orthopedic Surgery

## 2020-04-28 DIAGNOSIS — S46211A Strain of muscle, fascia and tendon of other parts of biceps, right arm, initial encounter: Secondary | ICD-10-CM

## 2020-05-01 ENCOUNTER — Encounter: Payer: Self-pay | Admitting: Orthopedic Surgery

## 2020-05-01 NOTE — Progress Notes (Signed)
Office Visit Note   Patient: Marc Castillo           Date of Birth: November 01, 1955           MRN: [id number] Visit Date: 04/28/2020 Requested by: [facility] [address] [place],  [place] [address] PCP: [facility]  Subjective: Chief Complaint  Patient presents with  . f/u right arm    HPI: Marc Castillo is a 64 year old patient with right distal biceps rupture.  This occurred about 6 weeks ago.  Still is having some weakness and has started physical therapy along with home exercise program.  He is okay doing work as a Counsellor.  He has to "lift all night long".  He is being careful at work.              ROS: All systems reviewed are negative as they relate to the chief complaint within the history of present illness.  Patient denies  fevers or chills.   Assessment & Plan: Visit Diagnoses:  1. Rupture of right distal biceps tendon, initial encounter     Plan: Impression is right distal biceps rupture by MRI scan but clinically he has enough healing in that he is very functional.  I think that he has achieved some level of healing of this problem.  Supination strength is relatively equal.  I am seeing him for the first time today and on my evaluation with this injury being now over 2 months old I would favor observation.  Should he develop any recurrent tearing or significant increase in pain or profound weakness particularly with supination he should come in immediately.  The tear was retracted about a centimeter and a half on my evaluation but not above the elbow flexion crease.  It is palpable today.  Potentially the lacertus has stabilized enough to allow it to heal.  Discussed with him at length the warning signs of worsening rupture and retraction and he will come in immediately if that does occur.  Follow-up as needed  Follow-Up Instructions: Return if symptoms worsen or fail to improve.   Orders:  No orders of the defined types were  placed in this encounter.  No orders of the defined types were placed in this encounter.     Procedures: No procedures performed   Clinical Data: No additional findings.  Objective: Vital Signs: There were no vitals taken for this visit.  Physical Exam:   Constitutional: Patient appears well-developed HEENT:  Head: Normocephalic Eyes:EOM are normal Neck: Normal range of motion Cardiovascular: Normal rate Pulmonary/chest: Effort normal Neurologic: Patient is alert Skin: Skin is warm Psychiatric: Patient has normal mood and affect    Ortho Exam: Ortho exam demonstrates equal supination strength bilaterally.  He does have a palpable tendon on the right left-hand side.  EPL FPL interosseous strength is intact.  Biceps strength is also equal and symmetric.  Slight contour difference right biceps versus left biceps but the tendon itself is palpable and not retracted.  Specialty Comments:  No specialty comments available.  Imaging: No results found.   PMFS History: Patient Active Problem List   Diagnosis Date Noted  . Rupture of right distal biceps tendon 03/17/2020  . Bone spur on posterior portion of calcaneus 10/05/2016  . Chronic gouty arthropathy without tophi 08/16/2015  . Encounter for long-term (current) use of high-risk medication 08/16/2015   Past Medical History:  Diagnosis Date  . Arthritis   . Gout   .  Kidney stone     Family History  Problem Relation Age of Onset  . Diabetes Father     Past Surgical History:  Procedure Laterality Date  . APPENDECTOMY    . KIDNEY STONE SURGERY     Social History   Occupational History  . Not on file  Tobacco Use  . Smoking status: Never Smoker  . Smokeless tobacco: Never Used  Substance and Sexual Activity  . Alcohol use: Yes    Comment: occassionally   . Drug use: Not on file  . Sexual activity: Not on file

## 2021-09-27 IMAGING — MR MR FOREARM*R* WO/W CM
14 series · 40 of 40 positions shown · IV contrast (gadavist)
Comparison: Right forearm x-rays dated February 01, 2020.

CLINICAL DATA: Right forearm pain.

EXAM:
MRI OF THE RIGHT FOREARM WITHOUT AND WITH CONTRAST
TECHNIQUE: Multiplanar, multisequence MR imaging of the right forearm was
performed before and after the administration of intravenous
contrast.
CONTRAST:  10mL GADAVIST GADOBUTROL 1 MMOL/ML IV SOLN

[Series 4: T1 · axial · right · 4.0mm · 0.44mm/px · z∈[-14,+140]mm · 4 of 32 slices shown (1 of 3)]
[im 1/32]
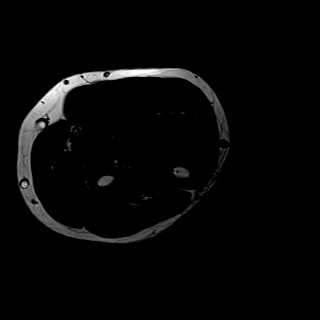
[im 11/32]
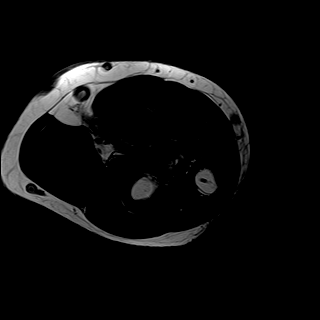
[im 21/32]
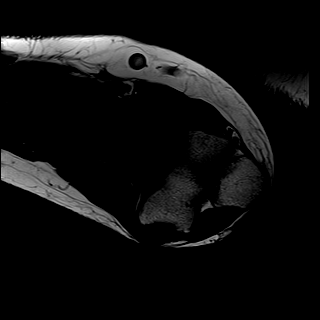
[im 32/32]
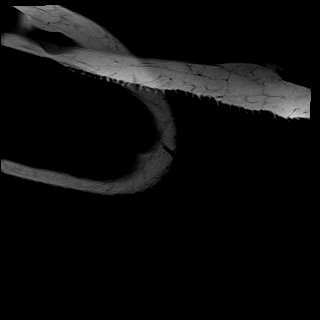

[Series 5: T1 · axial · right · 4.0mm · 0.44mm/px · z∈[-173,-19]mm · 4 of 32 slices shown (2 of 3)]
[im 1/32]
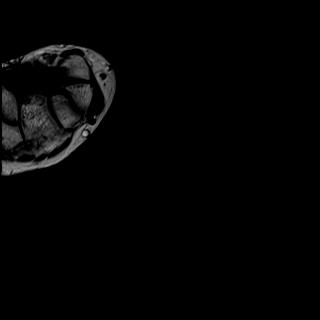
[im 11/32]
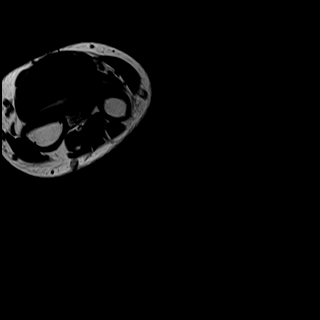
[im 21/32]
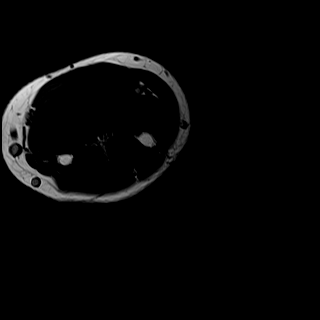
[im 32/32]
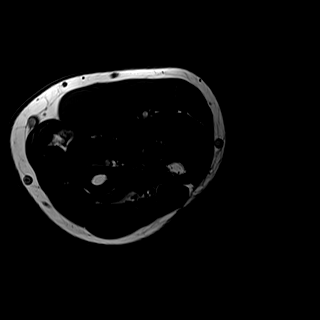

[Series 6: T2 fat-sat · axial · right · 4.0mm · 0.44mm/px · z∈[-14,+140]mm · 4 of 32 slices shown (1 of 3)]
[im 1/32]
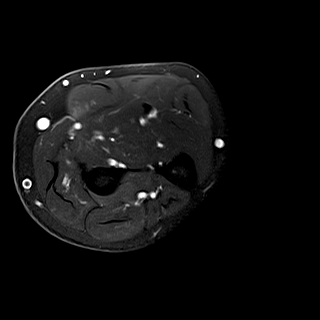
[im 11/32]
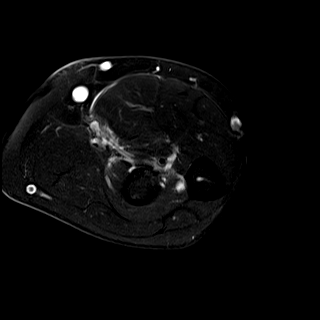
[im 21/32]
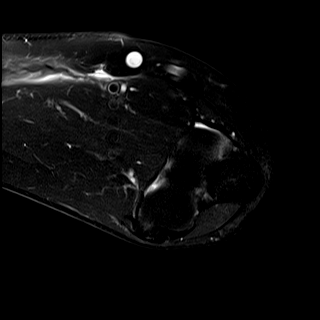
[im 32/32]
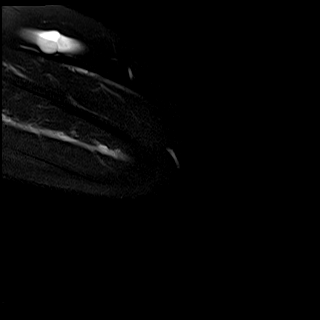

[Series 7: T2 fat-sat · axial · right · 4.0mm · 0.44mm/px · z∈[-173,-19]mm · 4 of 32 slices shown (2 of 3)]
[im 1/32]
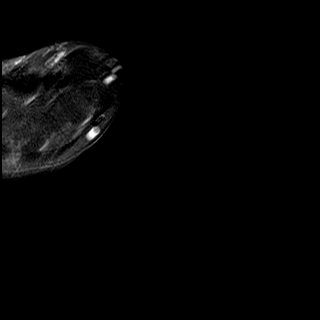
[im 11/32]
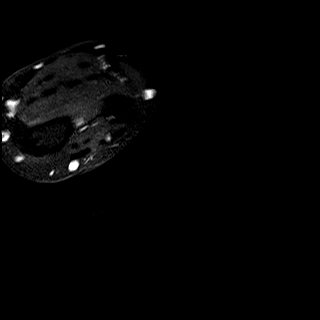
[im 21/32]
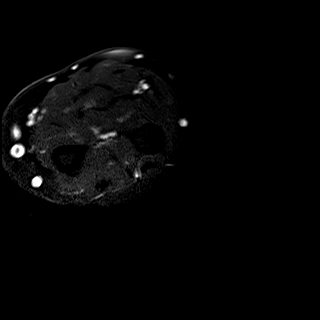
[im 32/32]
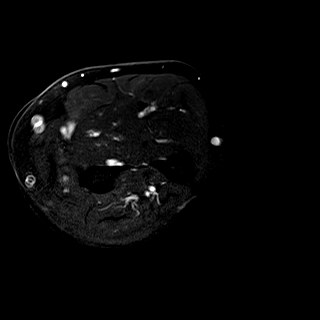

[Series 8: T1 fat-sat · axial · right · 4.0mm · 0.49mm/px · z∈[-14,+140]mm · 3 of 32 slices shown (1 of 2)]
[im 1/32]
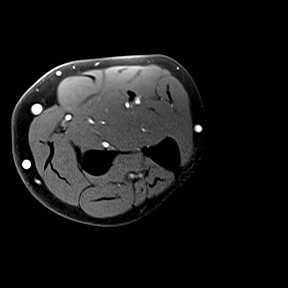
[im 16/32]
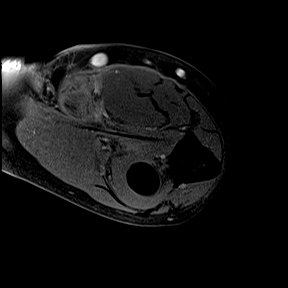
[im 32/32]
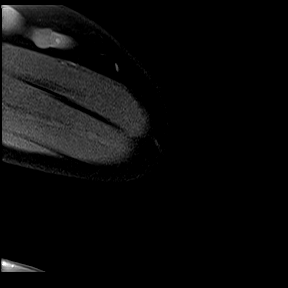

[Series 9: T1 fat-sat · axial · right · 4.0mm · 0.49mm/px · z∈[-173,-19]mm · 3 of 32 slices shown (2 of 2)]
[im 1/32]
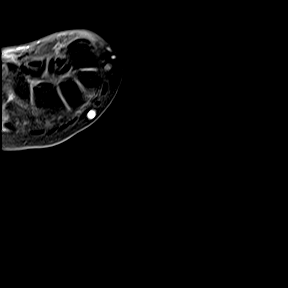
[im 16/32]
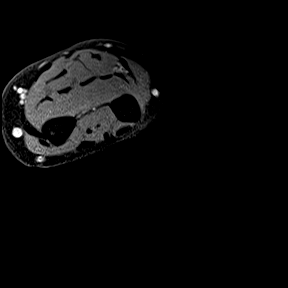
[im 32/32]
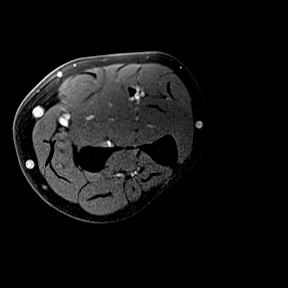

[Series 10: T1 · coronal · right · 4.0mm · 1.15mm/px · 2 of 20 slices shown (3 of 3)]
[im 1/20]
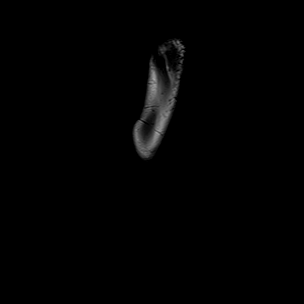
[im 20/20]
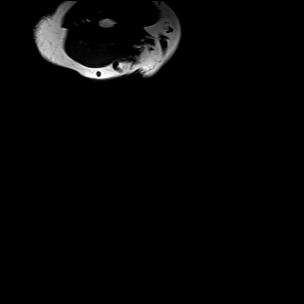

[Series 11: T2 fat-sat · coronal · right · 4.0mm · 1.04mm/px · 2 of 20 slices shown (3 of 3)]
[im 1/20]
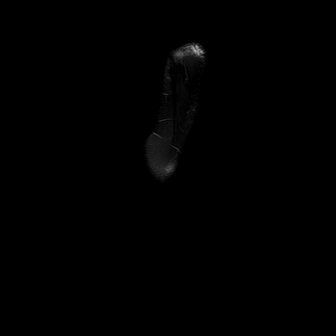
[im 20/20]
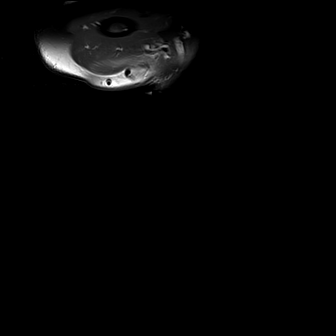

[Series 12: STIR · sagittal · right · 4.0mm · 1.22mm/px · 2 of 23 slices shown (1 of 2)]
[im 1/23]
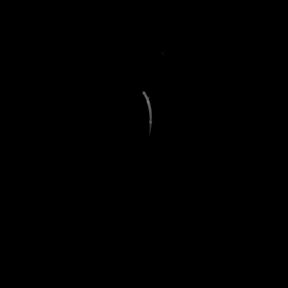
[im 23/23]
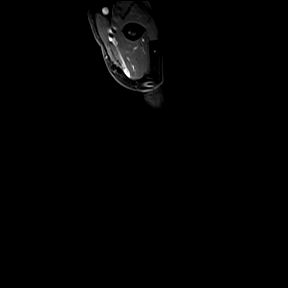

[Series 13: T1 fat-sat post-contrast · axial · right · 4.0mm · 0.44mm/px · z∈[-14,+140]mm · 3 of 32 slices shown (1 of 4)]
[im 1/32]
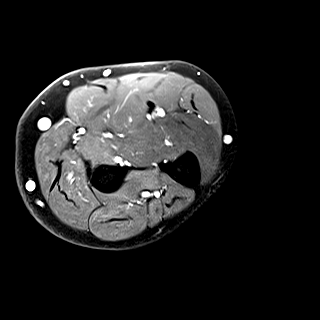
[im 16/32]
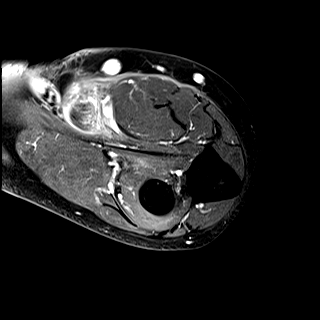
[im 32/32]
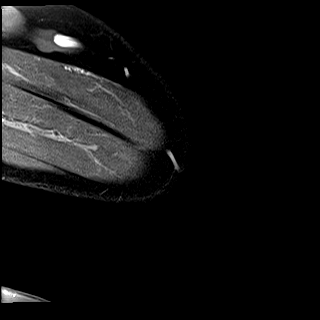

[Series 14: T1 fat-sat post-contrast · axial · right · 4.0mm · 0.44mm/px · z∈[-173,-19]mm · 3 of 32 slices shown (2 of 4)]
[im 1/32]
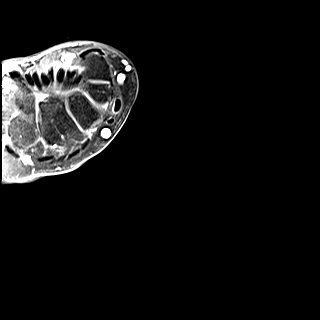
[im 16/32]
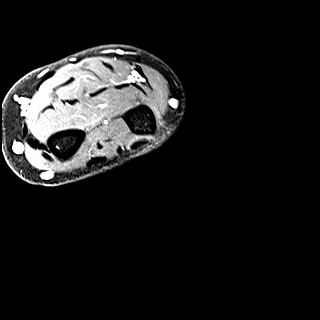
[im 32/32]
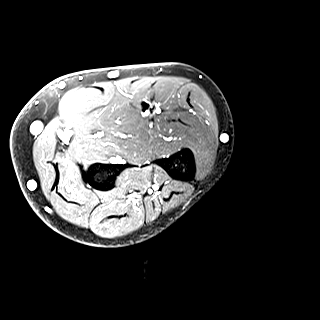

[Series 15: T1 fat-sat post-contrast · sagittal · right · 4.0mm · 1.04mm/px · 2 of 23 slices shown (3 of 4)]
[im 1/23]
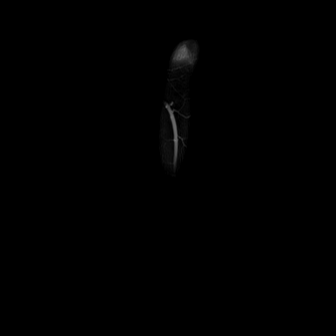
[im 23/23]
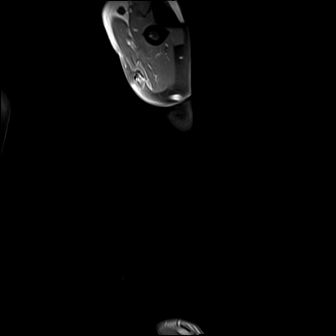

[Series 16: T1 fat-sat post-contrast · coronal · right · 4.0mm · 1.15mm/px · 2 of 20 slices shown (4 of 4)]
[im 1/20]
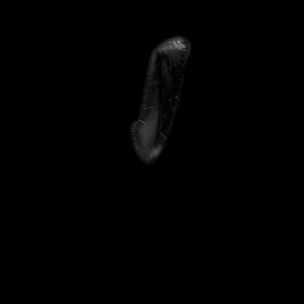
[im 20/20]
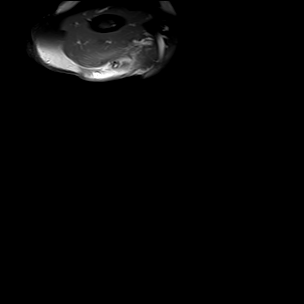

[Series 17: STIR · coronal · right · 4.0mm · 1.15mm/px · 2 of 20 slices shown (2 of 2)]
[im 1/20]
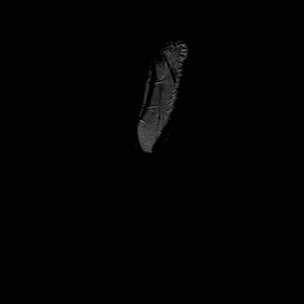
[im 20/20]
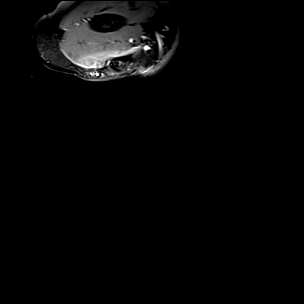

[40 of 40 positions shown; findings below may reference images not displayed]

FINDINGS: Bones/Joint/Cartilage

No marrow signal abnormality. No fracture or dislocation. Small cyst
in the trapezium. No joint effusion.

Ligaments

Collateral ligaments are intact.

Muscles and Tendons
Complete tear of the distal biceps tendon with 3.3 cm retraction
(series 6, images 15-18). No muscle edema or atrophy.

Soft tissue
No fluid collection or hematoma.  No soft tissue mass.
IMPRESSION: 1. Complete tear of the distal biceps tendon with 3.3 cm retraction.
2. No focal bone lesion.

## 2023-07-03 ENCOUNTER — Encounter (INDEPENDENT_AMBULATORY_CARE_PROVIDER_SITE_OTHER): Payer: Self-pay | Admitting: *Deleted

## 2023-08-02 ENCOUNTER — Telehealth (INDEPENDENT_AMBULATORY_CARE_PROVIDER_SITE_OTHER): Payer: Self-pay | Admitting: Gastroenterology

## 2023-08-02 NOTE — Telephone Encounter (Signed)
 Who is your primary care physician: Meredith Mody  Reasons for the colonoscopy:   Have you had a colonoscopy before?  Yes 2010  Do you have family history of colon cancer? no  Previous colonoscopy with polyps removed? Yes 2010  Do you have a history colorectal cancer?   no  Are you diabetic? If yes, Type 1 or Type 2?    no  Do you have a prosthetic or mechanical heart valve? no  Do you have a pacemaker/defibrillator?   no  Have you had endocarditis/atrial fibrillation? no  Have you had joint replacement within the last 12 months?  no  Do you tend to be constipated or have to use laxatives? no  Do you have any history of drugs or alchohol?  no  Do you use supplemental oxygen?  no  Have you had a stroke or heart attack within the last 6 months? no  Do you take weight loss medication?  no   Do you take any blood-thinning medications such as: (aspirin, warfarin, Plavix, Aggrenox)  no  If yes we need the name, milligram, dosage and who is prescribing doctor  Current Outpatient Medications on File Prior to Visit  Medication Sig Dispense Refill   atorvastatin (LIPITOR) 20 MG tablet Take 20 mg by mouth daily.     febuxostat (ULORIC) 40 MG tablet Take 1 tablet by mouth daily.     indomethacin (INDOCIN) 50 MG capsule TAKE 1 CAPSULE BY MOUTH 2 TO 3 TIMES DAILY FOR GOUT FLARE.     No current facility-administered medications on file prior to visit.    No Known Allergies   Pharmacy: [facility]  Primary Insurance Name: [employer]/Medicare  Best number where you can be reached: [phone]

## 2023-08-02 NOTE — Telephone Encounter (Signed)
 Ok to schedule.  Room 1/2  Thanks,  Vista Lawman, MD Gastroenterology and Hepatology [facility] Gastroenterology

## 2023-08-07 MED ORDER — PEG 3350-KCL-NA BICARB-NACL 420 G PO SOLR: 4000.0000 mL | Freq: Once | ORAL | 0 refills | Status: AC

## 2023-08-07 NOTE — Telephone Encounter (Signed)
 Pt returned call. Pt states he wants to make sure he is in network and that he will not have to pay anything. Will call insurance to verify

## 2023-08-07 NOTE — Telephone Encounter (Signed)
 Left message to return call

## 2023-08-07 NOTE — Addendum Note (Signed)
 Addended by: Marlowe Shores on: 08/07/2023 11:46 AM   Modules accepted: Orders

## 2023-08-07 NOTE — Telephone Encounter (Signed)
 Contacted pt insurance and representative states that TCS would be no deductible and no copay Returned call to pt and pt scheduled for 08/30/23. Prep sent to pharmacy. Instructions will be mailed to pt.

## 2023-08-08 ENCOUNTER — Encounter (INDEPENDENT_AMBULATORY_CARE_PROVIDER_SITE_OTHER): Payer: Self-pay | Admitting: *Deleted

## 2023-08-08 NOTE — Telephone Encounter (Signed)
 Referral completed, TCS apt letter sent to PCP

## 2023-08-30 ENCOUNTER — Ambulatory Visit (HOSPITAL_COMMUNITY): Admitting: Anesthesiology

## 2023-08-30 ENCOUNTER — Other Ambulatory Visit: Payer: Self-pay

## 2023-08-30 ENCOUNTER — Ambulatory Visit (HOSPITAL_COMMUNITY)
Admission: RE | Admit: 2023-08-30 | Discharge: 2023-08-30 | Disposition: A | Discharge status: Home / Self Care | Source: Ambulatory Visit | Attending: Gastroenterology | Admitting: Gastroenterology

## 2023-08-30 ENCOUNTER — Encounter (HOSPITAL_COMMUNITY): Payer: Self-pay | Admitting: Gastroenterology

## 2023-08-30 ENCOUNTER — Ambulatory Visit (HOSPITAL_BASED_OUTPATIENT_CLINIC_OR_DEPARTMENT_OTHER): Admitting: Anesthesiology

## 2023-08-30 ENCOUNTER — Encounter (INDEPENDENT_AMBULATORY_CARE_PROVIDER_SITE_OTHER): Payer: Self-pay | Admitting: *Deleted

## 2023-08-30 ENCOUNTER — Encounter (HOSPITAL_COMMUNITY)
Admission: RE | Disposition: A | Discharge status: Home / Self Care | Payer: Self-pay | Source: Ambulatory Visit | Attending: Gastroenterology

## 2023-08-30 DIAGNOSIS — K5730 Diverticulosis of large intestine without perforation or abscess without bleeding: Secondary | ICD-10-CM | POA: Diagnosis not present

## 2023-08-30 DIAGNOSIS — I10 Essential (primary) hypertension: Secondary | ICD-10-CM | POA: Diagnosis not present

## 2023-08-30 DIAGNOSIS — Z1211 Encounter for screening for malignant neoplasm of colon: Secondary | ICD-10-CM | POA: Diagnosis present

## 2023-08-30 HISTORY — DX: Personal history of urinary calculi: Z87.442

## 2023-08-30 LAB — HM COLONOSCOPY

## 2023-08-30 SURGERY — COLONOSCOPY
Anesthesia: General

## 2023-08-30 MED ORDER — PROPOFOL 500 MG/50ML IV EMUL
INTRAVENOUS | Status: DC | PRN
  Administered 2023-08-30: 150 ug/kg/min via INTRAVENOUS

## 2023-08-30 MED ORDER — LACTATED RINGERS IV SOLN
INTRAVENOUS | Status: DC
  Administered 2023-08-30: 500 mL via INTRAVENOUS

## 2023-08-30 MED ORDER — PROPOFOL 10 MG/ML IV BOLUS
INTRAVENOUS | Status: DC | PRN
  Administered 2023-08-30: 100 mg via INTRAVENOUS

## 2023-08-30 MED ORDER — LIDOCAINE HCL (PF) 2 % IJ SOLN
INTRAMUSCULAR | Status: DC | PRN
  Administered 2023-08-30: 50 mg via INTRADERMAL

## 2023-08-30 NOTE — Anesthesia Preprocedure Evaluation (Signed)
Anesthesia Evaluation  Patient identified by MRN, date of birth, ID band Patient awake    Reviewed: Allergy & Precautions, H&P , NPO status , Patient's Chart, lab work & pertinent test results, reviewed documented beta blocker date and time   Airway Mallampati: II  TM Distance: >3 FB Neck ROM: full    Dental no notable dental hx.    Pulmonary neg pulmonary ROS   Pulmonary exam normal breath sounds clear to auscultation       Cardiovascular Exercise Tolerance: Good hypertension, negative cardio ROS  Rhythm:regular Rate:Normal     Neuro/Psych negative neurological ROS  negative psych ROS   GI/Hepatic negative GI ROS, Neg liver ROS,,,  Endo/Other  negative endocrine ROS    Renal/GU Renal diseasenegative Renal ROS  negative genitourinary   Musculoskeletal   Abdominal   Peds  Hematology negative hematology ROS (+)   Anesthesia Other Findings   Reproductive/Obstetrics negative OB ROS                             Anesthesia Physical Anesthesia Plan  ASA: 2  Anesthesia Plan: General   Post-op Pain Management:    Induction:   PONV Risk Score and Plan: Propofol infusion  Airway Management Planned:   Additional Equipment:   Intra-op Plan:   Post-operative Plan:   Informed Consent: I have reviewed the patients History and Physical, chart, labs and discussed the procedure including the risks, benefits and alternatives for the proposed anesthesia with the patient or authorized representative who has indicated his/her understanding and acceptance.     Dental Advisory Given  Plan Discussed with: CRNA  Anesthesia Plan Comments:        Anesthesia Quick Evaluation

## 2023-08-30 NOTE — Anesthesia Postprocedure Evaluation (Signed)
 Anesthesia Post Note  Patient: Marc Castillo  Procedure(s) Performed: COLONOSCOPY  Patient location during evaluation: Phase II Anesthesia Type: General Level of consciousness: awake Pain management: pain level controlled Vital Signs Assessment: post-procedure vital signs reviewed and stable Respiratory status: spontaneous breathing and respiratory function stable Cardiovascular status: blood pressure returned to baseline and stable Postop Assessment: no headache and no apparent nausea or vomiting Anesthetic complications: no Comments: Late entry   No notable events documented.   Last Vitals:  Vitals:   08/30/23 0759 08/30/23 0932  BP: (!) 151/88 114/68  Pulse: 74 79  Resp: (!) 21 20  Temp: 36.7 C 36.5 C  SpO2: 97% 97%    Last Pain:  Vitals:   08/30/23 0932  TempSrc: Oral  PainSc: 0-No pain                 Windell Norfolk

## 2023-08-30 NOTE — Discharge Instructions (Signed)

## 2023-08-30 NOTE — Op Note (Signed)
 [facility] Patient Name: Marc Castillo Procedure Date: 08/30/2023 8:54 AM MRN: [id number] Date of Birth: 06/16/1955 Attending MD: Sanjuan Dame , MD, [id number] CSN: [id number] Age: 68 Admit Type: Outpatient Procedure:                Colonoscopy Indications:              Screening for colorectal malignant neoplasm Providers:                Sanjuan Dame, MD, Francoise Ceo RN, RN, Lennice Sites                            Technician, Technician, [place] Wilson Referring MD:              Medicines:                Monitored Anesthesia Care Complications:            No immediate complications. Estimated Blood Loss:     Estimated blood loss: none. Procedure:                Pre-Anesthesia Assessment:                           - Prior to the procedure, a History and Physical                            was performed, and patient medications and                            allergies were reviewed. The patient's tolerance of                            previous anesthesia was also reviewed. The risks                            and benefits of the procedure and the sedation                            options and risks were discussed with the patient.                            All questions were answered, and informed consent                            was obtained. Prior Anticoagulants: The patient has                            taken no anticoagulant or antiplatelet agents. ASA                            Grade Assessment: II - A patient with mild systemic                            disease. After reviewing the risks and benefits,  the patient was deemed in satisfactory condition to                            undergo the procedure.                           After obtaining informed consent, the colonoscope                            was passed under direct vision. Throughout the                            procedure, the patient's blood pressure, pulse, and                             oxygen saturations were monitored continuously. The                            [id number]) scope was introduced through the                            anus and advanced to the the cecum, identified by                            appendiceal orifice and ileocecal valve. The                            colonoscopy was performed without difficulty. The                            patient tolerated the procedure well. The quality                            of the bowel preparation was evaluated using the                            BBPS [facility] Bowel Preparation Scale) with scores                            of: Right Colon = 3, Transverse Colon = 3 and Left                            Colon = 3 (entire mucosa seen well with no residual                            staining, small fragments of stool or opaque                            liquid). The total BBPS score equals 9. The                            ileocecal valve, appendiceal orifice, and rectum  were photographed. Scope In: 9:13:48 AM Scope Out: 9:30:02 AM Scope Withdrawal Time: 0 hours 8 minutes 3 seconds  Total Procedure Duration: 0 hours 16 minutes 14 seconds  Findings:      The perianal and digital rectal examinations were normal.      Scattered large-mouthed diverticula were found in the left colon.      The exam was otherwise without abnormality. Impression:               - Diverticulosis in the left colon.                           - The examination was otherwise normal.                           - No specimens collected. Moderate Sedation:      Per Anesthesia Care Recommendation:           - Patient has a contact number available for                            emergencies. The signs and symptoms of potential                            delayed complications were discussed with the                            patient. Return to normal activities tomorrow.                             Written discharge instructions were provided to the                            patient.                           - High fiber diet.                           - Continue present medications.                           - Repeat colonoscopy in 10 years for screening                            purposes.                           - Return to primary care physician as previously                            scheduled. Procedure Code(s):        --- Professional ---                           O1308, Colorectal cancer screening; colonoscopy on                            individual  not meeting criteria for high risk Diagnosis Code(s):        --- Professional ---                           Z12.11, Encounter for screening for malignant                            neoplasm of colon                           K57.30, Diverticulosis of large intestine without                            perforation or abscess without bleeding CPT copyright 2022 American [employer] Association. All rights reserved. The codes documented in this report are preliminary and upon coder review may  be revised to meet current compliance requirements. Sanjuan Dame, MD Sanjuan Dame, MD 08/30/2023 9:36:38 AM This report has been signed electronically. Number of Addenda: 0

## 2023-08-30 NOTE — Transfer of Care (Signed)
 Immediate Anesthesia Transfer of Care Note  Patient: Marc Castillo  Procedure(s) Performed: COLONOSCOPY  Patient Location: Endoscopy Unit  Anesthesia Type:General  Level of Consciousness: drowsy  Airway & Oxygen Therapy: Patient Spontanous Breathing  Post-op Assessment: Report given to RN and Post -op Vital signs reviewed and stable  Post vital signs: Reviewed and stable  Last Vitals:  Vitals Value Taken Time  BP    Temp    Pulse    Resp    SpO2      Last Pain:  Vitals:   08/30/23 0910  TempSrc:   PainSc: 0-No pain      Patients Stated Pain Goal: 8 (08/30/23 0759)  Complications: No notable events documented.

## 2023-08-30 NOTE — H&P (Signed)
 Primary Care Physician:  Ardath Sax, FNP Primary Gastroenterologist:  Dr. Tasia Catchings  Pre-Procedure History & Physical: HPI:  Marc Castillo is a 68 y.o. male is here for a colonoscopy for colon cancer screening purposes.  Patient denies any family history of colorectal cancer.  No melena or hematochezia.  No abdominal pain or unintentional weight loss.  No change in bowel habits.  Overall feels well from a GI standpoint.  Last colonoscopy 2011  Past Medical History:  Diagnosis Date   Arthritis    Gout    History of kidney stones    Kidney stone     Past Surgical History:  Procedure Laterality Date   APPENDECTOMY     KIDNEY STONE SURGERY      Prior to Admission medications   Medication Sig Start Date End Date Taking? Authorizing Provider  atorvastatin (LIPITOR) 20 MG tablet Take 20 mg by mouth daily. 02/14/20  Yes [provider]  febuxostat (ULORIC) 40 MG tablet Take 1 tablet by mouth daily. 02/16/20  Yes [provider]  indomethacin (INDOCIN) 50 MG capsule TAKE 1 CAPSULE BY MOUTH 2 TO 3 TIMES DAILY FOR GOUT FLARE. 01/29/20  Yes [provider]    Allergies as of 08/07/2023   (No Known Allergies)    Family History  Problem Relation Age of Onset   Diabetes Father     Social History   Socioeconomic History   Marital status: Married    Spouse name: Not on file   Number of children: Not on file   Years of education: Not on file   Highest education level: Not on file  Occupational History   Not on file  Tobacco Use   Smoking status: Never   Smokeless tobacco: Never  Vaping Use   Vaping status: Never Used  Substance and Sexual Activity   Alcohol use: Yes    Comment: occassionally    Drug use: Never   Sexual activity: Not on file  Other Topics Concern   Not on file  Social History Narrative   Not on file   Social Drivers of Health   Financial Resource Strain: Not on file  Food Insecurity: Not on file  Transportation Needs: Not on  file  Physical Activity: Not on file  Stress: Not on file  Social Connections: Not on file  Intimate Partner Violence: Not on file    Review of Systems: See HPI, otherwise negative ROS  Physical Exam: Vital signs in last 24 hours: Temp:  [98.1 F (36.7 C)] 98.1 F (36.7 C) (04/10 0759) Pulse Rate:  [74] 74 (04/10 0759) Resp:  [21] 21 (04/10 0759) BP: (151)/(88) 151/88 (04/10 0759) SpO2:  [97 %] 97 % (04/10 0759) Weight:  [97.5 kg] 97.5 kg (04/10 0759)   General:   Alert,  Well-developed, well-nourished, pleasant and cooperative in NAD Head:  Normocephalic and atraumatic. Eyes:  Sclera clear, no icterus.   Conjunctiva pink. Ears:  Normal auditory acuity. Nose:  No deformity, discharge,  or lesions. Msk:  Symmetrical without gross deformities. Normal posture. Extremities:  Without clubbing or edema. Neurologic:  Alert and  oriented x4;  grossly normal neurologically. Skin:  Intact without significant lesions or rashes. Psych:  Alert and cooperative. Normal mood and affect.  Impression/Plan: Marc Castillo is here for a colonoscopy to be performed for colon cancer screening purposes.  The risks of the procedure including infection, bleed, or perforation as well as benefits, limitations, alternatives and imponderables have been reviewed with the patient.  Questions have been answered. All parties agreeable.

## 2023-08-30 NOTE — Anesthesia Procedure Notes (Signed)
 Date/Time: 08/30/2023 9:09 AM  Performed by: Julian Reil, CRNAPre-anesthesia Checklist: Patient identified, Emergency Drugs available, Suction available and Patient being monitored Patient Re-evaluated:Patient Re-evaluated prior to induction Oxygen Delivery Method: Nasal cannula Induction Type: IV induction Placement Confirmation: positive ETCO2

## 2023-08-31 ENCOUNTER — Encounter (HOSPITAL_COMMUNITY): Payer: Self-pay | Admitting: Gastroenterology
